# Patient Record
Sex: Female | Born: 1965 | Race: White | Hispanic: No | Marital: Married | Smoking: Never smoker
Health system: Southern US, Community
[De-identification: ages and names within clinical notes are randomized; demographics above are authoritative.]

---

## 2015-09-08 ENCOUNTER — Emergency Department (HOSPITAL_COMMUNITY): Payer: Managed Care, Other (non HMO)

## 2015-09-08 ENCOUNTER — Emergency Department (HOSPITAL_COMMUNITY)
Admission: EM | Admit: 2015-09-08 | Discharge: 2015-09-08 | Disposition: A | Discharge status: Home / Self Care | Payer: Managed Care, Other (non HMO) | Attending: Emergency Medicine | Admitting: Emergency Medicine

## 2015-09-08 ENCOUNTER — Encounter (HOSPITAL_COMMUNITY): Payer: Self-pay

## 2015-09-08 DIAGNOSIS — S62522A Displaced fracture of distal phalanx of left thumb, initial encounter for closed fracture: Secondary | ICD-10-CM | POA: Insufficient documentation

## 2015-09-08 DIAGNOSIS — S20219A Contusion of unspecified front wall of thorax, initial encounter: Secondary | ICD-10-CM | POA: Insufficient documentation

## 2015-09-08 DIAGNOSIS — Y999 Unspecified external cause status: Secondary | ICD-10-CM | POA: Diagnosis not present

## 2015-09-08 DIAGNOSIS — S20211A Contusion of right front wall of thorax, initial encounter: Secondary | ICD-10-CM

## 2015-09-08 DIAGNOSIS — Y939 Activity, unspecified: Secondary | ICD-10-CM | POA: Insufficient documentation

## 2015-09-08 DIAGNOSIS — S40011A Contusion of right shoulder, initial encounter: Secondary | ICD-10-CM

## 2015-09-08 DIAGNOSIS — S7001XA Contusion of right hip, initial encounter: Secondary | ICD-10-CM | POA: Diagnosis not present

## 2015-09-08 DIAGNOSIS — S6992XA Unspecified injury of left wrist, hand and finger(s), initial encounter: Secondary | ICD-10-CM | POA: Diagnosis present

## 2015-09-08 DIAGNOSIS — Y92410 Unspecified street and highway as the place of occurrence of the external cause: Secondary | ICD-10-CM | POA: Insufficient documentation

## 2015-09-08 DIAGNOSIS — S62232A Other displaced fracture of base of first metacarpal bone, left hand, initial encounter for closed fracture: Secondary | ICD-10-CM

## 2015-09-08 MED ORDER — KETOROLAC TROMETHAMINE 10 MG PO TABS: 10.0000 mg | ORAL_TABLET | Freq: Four times a day (QID) | ORAL | Status: AC | PRN

## 2015-09-08 MED ORDER — HYDROCODONE-ACETAMINOPHEN 5-325 MG PO TABS
1.0000 | ORAL_TABLET | Freq: Once | ORAL | Status: DC
  Filled 2015-09-08: qty 1

## 2015-09-08 MED ORDER — KETOROLAC TROMETHAMINE 30 MG/ML IJ SOLN
30.0000 mg | Freq: Once | INTRAMUSCULAR | Status: AC
  Administered 2015-09-08: 30 mg via INTRAMUSCULAR
  Filled 2015-09-08: qty 1

## 2015-09-08 MED ORDER — IBUPROFEN 400 MG PO TABS
600.0000 mg | ORAL_TABLET | Freq: Once | ORAL | Status: AC
  Administered 2015-09-08: 600 mg via ORAL
  Filled 2015-09-08: qty 1

## 2015-09-08 MED ORDER — OXYCODONE-ACETAMINOPHEN 5-325 MG PO TABS: 1.0000 | ORAL_TABLET | ORAL | Status: DC | PRN

## 2015-09-08 MED ORDER — TETANUS-DIPHTH-ACELL PERTUSSIS 5-2.5-18.5 LF-MCG/0.5 IM SUSP
0.5000 mL | Freq: Once | INTRAMUSCULAR | Status: AC
  Administered 2015-09-08: 0.5 mL via INTRAMUSCULAR
  Filled 2015-09-08: qty 0.5

## 2015-09-08 MED ORDER — DIAZEPAM 5 MG PO TABS: 5.0000 mg | ORAL_TABLET | Freq: Two times a day (BID) | ORAL | Status: DC
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
## 2015-09-08 NOTE — Progress Notes (Signed)
Orthopedic Tech Progress Note Patient Details:  Stacey Franklin 1965/05/07 [id number]  Ortho Devices Type of Ortho Device: Ace wrap, Thumb spica splint Splint Material: Fiberglass Ortho Device/Splint Location: LUE thumb Ortho Device/Splint Interventions: Ordered, Application   Stacey Franklin, Stacey Franklin 09/08/2015, 6:38 PM

## 2015-09-08 NOTE — ED Notes (Signed)
Pt departed in NAD.  
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
